# Patient Record
Sex: Female | Born: 2000 | Hispanic: Yes | Marital: Single | Smoking: Never smoker
Health system: Southern US, Community
[De-identification: ages and names within clinical notes are randomized; demographics above are authoritative.]

## PROBLEM LIST (undated history)

## (undated) DIAGNOSIS — Z789 Other specified health status: Secondary | ICD-10-CM

## (undated) HISTORY — PX: WISDOM TOOTH EXTRACTION: SHX21

## (undated) HISTORY — DX: Other specified health status: Z78.9

---
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
## 2019-01-20 ENCOUNTER — Other Ambulatory Visit: Payer: Self-pay

## 2019-01-20 DIAGNOSIS — Z20822 Contact with and (suspected) exposure to covid-19: Secondary | ICD-10-CM

## 2019-01-22 LAB — NOVEL CORONAVIRUS, NAA: SARS-CoV-2, NAA: NOT DETECTED

## 2019-11-19 ENCOUNTER — Ambulatory Visit (LOCAL_COMMUNITY_HEALTH_CENTER): Payer: Medicaid Other | Admitting: Advanced Practice Midwife

## 2019-11-19 ENCOUNTER — Ambulatory Visit: Payer: Self-pay

## 2019-11-19 ENCOUNTER — Other Ambulatory Visit: Payer: Self-pay

## 2019-11-19 NOTE — Progress Notes (Signed)
Presents to clinic requesting birth control patch (has [employer] Medicaid). Client states has physical appt this pm with PCP [facility]). Counseled that physical needed this am at [facility] prior to prescription for patch. Client elects to keep pm appt with PCP and get patch prescription at that time. Counseled that PAP smear due at age 19 and could return in one year for PAP / physical at [facility]. Client is agreeable with plan. Jossie Ng, RN

## 2020-05-14 ENCOUNTER — Encounter: Payer: Self-pay | Admitting: Certified Nurse Midwife

## 2020-05-17 ENCOUNTER — Ambulatory Visit (INDEPENDENT_AMBULATORY_CARE_PROVIDER_SITE_OTHER): Payer: Commercial Managed Care - PPO | Admitting: Certified Nurse Midwife

## 2020-05-17 ENCOUNTER — Other Ambulatory Visit: Payer: Self-pay

## 2020-05-17 ENCOUNTER — Other Ambulatory Visit (HOSPITAL_COMMUNITY)
Admission: RE | Admit: 2020-05-17 | Discharge: 2020-05-17 | Disposition: A | Discharge status: Home / Self Care | Payer: Commercial Managed Care - PPO | Source: Ambulatory Visit | Attending: Certified Nurse Midwife | Admitting: Certified Nurse Midwife

## 2020-05-17 ENCOUNTER — Encounter: Payer: Self-pay | Admitting: Certified Nurse Midwife

## 2020-05-17 VITALS — BP 97/72 | HR 83 | Ht 60.0 in | Wt 108.3 lb

## 2020-05-17 DIAGNOSIS — R102 Pelvic and perineal pain unspecified side: Secondary | ICD-10-CM

## 2020-05-17 DIAGNOSIS — R300 Dysuria: Secondary | ICD-10-CM | POA: Diagnosis not present

## 2020-05-17 DIAGNOSIS — Z113 Encounter for screening for infections with a predominantly sexual mode of transmission: Secondary | ICD-10-CM | POA: Insufficient documentation

## 2020-05-17 DIAGNOSIS — R112 Nausea with vomiting, unspecified: Secondary | ICD-10-CM

## 2020-05-17 DIAGNOSIS — N9489 Other specified conditions associated with female genital organs and menstrual cycle: Secondary | ICD-10-CM | POA: Diagnosis not present

## 2020-05-17 LAB — POCT URINALYSIS DIPSTICK
Bilirubin, UA: NEGATIVE
Blood, UA: NEGATIVE
Glucose, UA: NEGATIVE
Ketones, UA: NEGATIVE
Leukocytes, UA: NEGATIVE
Nitrite, UA: NEGATIVE
Protein, UA: NEGATIVE
Spec Grav, UA: 1.01 (ref 1.010–1.025)
Urobilinogen, UA: 0.2 E.U./dL
pH, UA: 7 (ref 5.0–8.0)

## 2020-05-17 MED ORDER — ONDANSETRON HCL 4 MG PO TABS: 4.0000 mg | ORAL_TABLET | Freq: Three times a day (TID) | ORAL | 0 refills | Status: AC | PRN

## 2020-05-17 NOTE — Patient Instructions (Addendum)
Ondansetron tablets What is this medicine? ONDANSETRON (on DAN se tron) is used to treat nausea and vomiting caused by chemotherapy. It is also used to prevent or treat nausea and vomiting after surgery. This medicine may be used for other purposes; ask your health care provider or pharmacist if you have questions. COMMON BRAND NAME(S): Zofran What should I tell my health care provider before I take this medicine? They need to know if you have any of these conditions:  heart disease  history of irregular heartbeat  liver disease  low levels of magnesium or potassium in the blood  an unusual or allergic reaction to ondansetron, granisetron, other medicines, foods, dyes, or preservatives  pregnant or trying to get pregnant  breast-feeding How should I use this medicine? Take this medicine by mouth with a glass of water. Follow the directions on your prescription label. Take your doses at regular intervals. Do not take your medicine more often than directed. Talk to your pediatrician regarding the use of this medicine in children. Special care may be needed. Overdosage: If you think you have taken too much of this medicine contact a poison control center or emergency room at once. NOTE: This medicine is only for you. Do not share this medicine with others. What if I miss a dose? If you miss a dose, take it as soon as you can. If it is almost time for your next dose, take only that dose. Do not take double or extra doses. What may interact with this medicine? Do not take this medicine with any of the following medications:  apomorphine  certain medicines for fungal infections like fluconazole, itraconazole, ketoconazole, posaconazole, voriconazole  cisapride  dronedarone  pimozide  thioridazine This medicine may also interact with the following medications:  carbamazepine  certain medicines for depression, anxiety, or psychotic disturbances  fentanyl  linezolid  MAOIs  like Carbex, Eldepryl, Marplan, Nardil, and Parnate  methylene blue (injected into a vein)  other medicines that prolong the QT interval (cause an abnormal heart rhythm) like dofetilide, ziprasidone  phenytoin  rifampicin  tramadol This list may not describe all possible interactions. Give your health care provider a list of all the medicines, herbs, non-prescription drugs, or dietary supplements you use. Also tell them if you smoke, drink alcohol, or use illegal drugs. Some items may interact with your medicine. What should I watch for while using this medicine? Check with your doctor or health care professional right away if you have any sign of an allergic reaction. What side effects may I notice from receiving this medicine? Side effects that you should report to your doctor or health care professional as soon as possible:  allergic reactions like skin rash, itching or hives, swelling of the face, lips or tongue  breathing problems  confusion  dizziness  fast or irregular heartbeat  feeling faint or lightheaded, falls  fever and chills  loss of balance or coordination  seizures  sweating  swelling of the hands or feet  tightness in the chest  tremors  unusually weak or tired Side effects that usually do not require medical attention (report to your doctor or health care professional if they continue or are bothersome):  constipation or diarrhea  headache This list may not describe all possible side effects. Call your doctor for medical advice about side effects. You may report side effects to FDA at 1-800-FDA-1088. Where should I keep my medicine? Keep out of the reach of children. Store between 2 and 30 degrees  C (36 and 86 degrees F). Throw away any unused medicine after the expiration date. NOTE: This sheet is a summary. It may not cover all possible information. If you have questions about this medicine, talk to your doctor, pharmacist, or health care  provider.  2021 Elsevier/Gold Standard (2018-04-02 07:16:43)   Dysuria Dysuria is pain or discomfort during urination. The pain or discomfort may be felt in the part of the body that drains urine from the bladder (urethra) or in the surrounding tissue of the genitals. The pain may also be felt in the groin area, lower abdomen, or lower back. You may have to urinate frequently or have the sudden feeling that you have to urinate (urgency). Dysuria can affect anyone, but it is more common in females. Dysuria can be caused by many different things, including:  Urinary tract infection.  Kidney stones or bladder stones.  Certain STIs (sexually transmitted infections), such as chlamydia.  Dehydration.  Inflammation of the tissues of the vagina.  Use of certain medicines.  Use of certain soaps or scented products that cause irritation. Follow these instructions at home: Medicines  Take over-the-counter and prescription medicines only as told by your health care provider.  If you were prescribed an antibiotic medicine, take it as told by your health care provider. Do not stop taking the antibiotic even if you start to feel better. Eating and drinking  Drink enough fluid to keep your urine pale yellow.  Avoid caffeinated beverages, tea, and alcohol. These beverages can irritate the bladder and make dysuria worse. In males, alcohol may irritate the prostate.   General instructions  Watch your condition for any changes.  Urinate often. Avoid holding urine for long periods of time.  If you are female, you should wipe from front to back after urinating or having a bowel movement. Use each piece of toilet paper only once.  Empty your bladder after sex.  Keep all follow-up visits. This is important.  If you had any tests done to find the cause of dysuria, it is up to you to get your test results. Ask your health care provider, or the department that is doing the test, when your results  will be ready. Contact a health care provider if:  You have a fever.  You develop pain in your back or sides.  You have nausea or vomiting.  You have blood in your urine.  You are not urinating as often as you usually do. Get help right away if:  Your pain is severe and not relieved with medicines.  You cannot eat or drink without vomiting.  You are confused.  You have a rapid heartbeat while resting.  You have shaking or chills.  You feel extremely weak. Summary  Dysuria is pain or discomfort while urinating. Many different conditions can lead to dysuria.  If you have dysuria, you may have to urinate frequently or have the sudden feeling that you have to urinate (urgency).  Watch your condition for any changes. Keep all follow-up visits.  Make sure that you urinate often and drink enough fluid to keep your urine pale yellow. This information is not intended to replace advice given to you by your health care provider. Make sure you discuss any questions you have with your health care provider. Document Revised: 11/21/2019 Document Reviewed: 11/21/2019 Elsevier Patient Education  2021 [employer].

## 2020-05-17 NOTE — Progress Notes (Signed)
Pt present for having pain and cramping with cycles. Pt c/o severe pain and cramping last cycle and noticed pain with urination about a week ago. UA completed and documented.

## 2020-05-17 NOTE — Progress Notes (Signed)
GYN ENCOUNTER NOTE  Subjective:       Rachel Weaver is a 20 y.o. G0P0000 female is here for gynecologic evaluation of the following issues:  1. Cramping after sexual intercourse, new onset-started two (2) weeks ago 2. Painful urination x one (1) episode last week 3. Nausea and vomiting on first day of initiating contraceptive patch   Denies breathing difficulty, respiratory distress, chest pain, abdominal pain, excessive vaginal bleeding and leg swelling or pain.  Gynecologic History  Patient's last menstrual period was 05/12/2020.  Period Duration (Days): 4-5 Period Pattern: Regular Menstrual Flow: Moderate,Light Menstrual Control: Thin pad,Tampon,Panty liner Menstrual Control Change Freq (Hours): 3-6 Dysmenorrhea: (!) Moderate Dysmenorrhea Symptoms: Cramping,Nausea  Contraception: Ortho-Evra patches weekly   Last Pap: N/A  Obstetric History  OB History  Gravida Para Term Preterm AB Living  0 0 0 0 0 0  SAB IAB Ectopic Multiple Live Births  0 0 0 0 0    Past Medical History:  Diagnosis Date  . No pertinent past medical history     Past Surgical History:  Procedure Laterality Date  . WISDOM TOOTH EXTRACTION      Current Outpatient Medications on File Prior to Visit  Medication Sig Dispense Refill  . norelgestromin-ethinyl estradiol [facility]) 150-35 MCG/24HR transdermal patch Place 1 patch onto the skin once a week.     No current facility-administered medications on file prior to visit.    No Known Allergies  Social History   Socioeconomic History  . Marital status: Single    Spouse name: Not on file  . Number of children: Not on file  . Years of education: Not on file  . Highest education level: Not on file  Occupational History  . Not on file  Tobacco Use  . Smoking status: Never Smoker  . Smokeless tobacco: Never Used  Vaping Use  . Vaping Use: Never used  Substance and Sexual Activity  . Alcohol use: Not Currently  . Drug use: Never  .  Sexual activity: Yes    Birth control/protection: Patch  Other Topics Concern  . Not on file  Social History Narrative  . Not on file   Social Determinants of Health   Financial Resource Strain: Not on file  Food Insecurity: Not on file  Transportation Needs: Not on file  Physical Activity: Not on file  Stress: Not on file  Social Connections: Not on file  Intimate Partner Violence: Not on file    Family History  Problem Relation Age of Onset  . Healthy Mother   . Healthy Father   . Hypertension Maternal Grandmother     The following portions of the patient's history were reviewed and updated as appropriate: allergies, current medications, past family history, past medical history, past social history, past surgical history and problem list.  Review of Systems  ROS- Negative other than what was reported above. Information obtained verbally from patient.  Objective:   BP 97/72   Pulse 83   Ht 5' (1.524 m)   Wt 49.1 kg   LMP 05/12/2020   BMI 21.15 kg/m    CONSTITUTIONAL: Well-developed, well-nourished female in no acute distress.   PHYSICAL EXAM: Not indicated  Assessment:   1. Dysuria - POCT urinalysis dipstick  2. Pelvic Pain  3. Nausea and vomiting, unspecified   Plan:   Vaginal swab self collected, see orders  Urine culture, see orders  Rx Zofran, see orders   Discussed possible cause of cramping after intercourse, patient verbalized understanding.  Reviewed red flag symptoms and when to call.   RTC if symptoms do not improve or worsen.   Juliann Pares, Student-Mid[employer]ife [employer] 05/17/20 4:45 PM

## 2020-05-17 NOTE — Progress Notes (Signed)
I have seen, interviewed, and examined the patient in conjunction with the [employer] and affirm the diagnosis and management plan.   Gunnar Bulla, CNM [facility], [facility] 05/17/20 5:06 PM

## 2020-05-19 LAB — CERVICOVAGINAL ANCILLARY ONLY
Bacterial Vaginitis (gardnerella): NEGATIVE
Candida Glabrata: NEGATIVE
Candida Vaginitis: NEGATIVE
Chlamydia: NEGATIVE
Comment: NEGATIVE
Comment: NEGATIVE
Comment: NEGATIVE
Comment: NEGATIVE
Comment: NEGATIVE
Comment: NORMAL
Neisseria Gonorrhea: NEGATIVE
Trichomonas: NEGATIVE

## 2020-05-19 LAB — URINE CULTURE: Organism ID, Bacteria: NO GROWTH

## 2021-01-14 ENCOUNTER — Telehealth: Payer: Self-pay | Admitting: Certified Nurse Midwife

## 2021-01-14 NOTE — Telephone Encounter (Signed)
Rachel Weaver called in asking for a refill on XULANE. I informed patient Rachel Weaver was no longer here and patient stated she ran out of her birth control and needs as soon as possible.  I told patient she was going to have to see another provider here before something could be called in.  Patient wanted me to ask if she can still have something called in and she would make an appointment later.  Patient requested Rachel Weaver.  Please advise.

## 2021-01-18 NOTE — Telephone Encounter (Signed)
LVM with patient to call back in regards to [facility] refill.
